# Patient Record
Sex: Male | Born: 2000 | Race: Black or African American | Hispanic: No | Marital: Single
Health system: Southern US, Community
[De-identification: ages and names within clinical notes are randomized; demographics above are authoritative.]

---

## 2020-08-16 ENCOUNTER — Encounter (HOSPITAL_COMMUNITY): Payer: Self-pay

## 2020-08-16 ENCOUNTER — Emergency Department (HOSPITAL_COMMUNITY): Payer: 59

## 2020-08-16 ENCOUNTER — Emergency Department (HOSPITAL_COMMUNITY)
Admission: EM | Admit: 2020-08-16 | Discharge: 2020-08-17 | Disposition: A | Discharge status: Home / Self Care | Payer: 59 | Attending: Emergency Medicine | Admitting: Emergency Medicine

## 2020-08-16 DIAGNOSIS — S6991XA Unspecified injury of right wrist, hand and finger(s), initial encounter: Secondary | ICD-10-CM | POA: Diagnosis present

## 2020-08-16 DIAGNOSIS — S62306A Unspecified fracture of fifth metacarpal bone, right hand, initial encounter for closed fracture: Secondary | ICD-10-CM | POA: Diagnosis not present

## 2020-08-16 DIAGNOSIS — W228XXA Striking against or struck by other objects, initial encounter: Secondary | ICD-10-CM | POA: Diagnosis not present

## 2020-08-16 DIAGNOSIS — S62346A Nondisplaced fracture of base of fifth metacarpal bone, right hand, initial encounter for closed fracture: Secondary | ICD-10-CM

## 2020-08-16 NOTE — ED Triage Notes (Signed)
Pt states that he punched a trailer yesterday and now having hand pain

## 2020-08-16 NOTE — ED Provider Notes (Signed)
Emergency Medicine Provider Triage Evaluation Note  Wayne Becker , a 20 y.o. male  was evaluated in triage.  Pt complains of right hand pain that started yesterday after he punched a trailer.  Review of Systems  Positive: Hand pain Negative: wound  Physical Exam  BP 123/72   Pulse 95   Temp 98.7 F (37.1 C) (Oral)   Resp 18   SpO2 97%  Gen:   Awake, no distress   Resp:  Normal effort  MSK:   Moves extremities without difficulty Other:  TTP over the right 4th and 5th metacarpals  Medical Decision Making  Medically screening exam initiated at 8:04 PM.  Appropriate orders placed.  Wayne Becker was informed that the remainder of the evaluation will be completed by another provider, this initial triage assessment does not replace that evaluation, and the importance of remaining in the ED until their evaluation is complete.     Wayne Becker 08/16/20 2004    Wayne Loveless, MD 08/16/20 [phone]

## 2020-08-17 NOTE — ED Notes (Signed)
Discharged home at [facility]

## 2020-08-17 NOTE — ED Provider Notes (Signed)
[facility] EMERGENCY DEPARTMENT Provider Note   CSN: [id number] Arrival date & time: 08/16/20  1958     History Chief Complaint  Patient presents with   Hand Pain    Wayne Becker is a 20 y.o. male with no past medical history who presents the emergency department with a chief complaint of right hand pain.  The patient reports sudden onset, intermittent right hand pain that began yesterday after he got angry and punched a trailer.  He reports the pain is brought on by moving his fingers of the right hand.  Improved with not moving the fingers of the right hand.  He denies numbness, weakness, right wrist or elbow pain.  No fever or chills.  No other complaints at this time.  No treatment prior to arrival.  The history is provided by medical records. No language interpreter was used.      History reviewed. No pertinent past medical history.  There are no problems to display for this patient.   History reviewed. No pertinent surgical history.     No family history on file.     Home Medications Prior to Admission medications   Not on File    Allergies    Patient has no known allergies.  Review of Systems   Review of Systems  Constitutional:  Negative for activity change.  Respiratory:  Negative for shortness of breath.   Cardiovascular:  Negative for chest pain.  Gastrointestinal:  Negative for abdominal pain.  Musculoskeletal:  Positive for arthralgias and myalgias. Negative for back pain.  Skin:  Negative for rash and wound.  Neurological:  Negative for weakness and numbness.   Physical Exam Updated Vital Signs BP 128/88   Pulse (!) 50   Temp 98.1 F (36.7 C)   Resp 16   SpO2 100%   Physical Exam Vitals and nursing note reviewed.  Constitutional:      Appearance: He is well-developed.  HENT:     Head: Normocephalic.  Eyes:     Conjunctiva/sclera: Conjunctivae normal.  Cardiovascular:     Rate and Rhythm: Normal rate and  regular rhythm.     Heart sounds: No murmur heard. Pulmonary:     Effort: Pulmonary effort is normal.  Abdominal:     General: There is no distension.     Palpations: Abdomen is soft.  Musculoskeletal:     Cervical back: Neck supple.     Comments: Point tenderness to palpation over the right fifth metacarpal.  No obvious deformities.  Full active and passive range of motion of all digits of the right hand.  Good capillary refill of all digits of the right hand.  Sensations intact all 4 distal aspects of all digits of the right hand.  Good grip strength.  Full active and passive range of motion of the right wrist and elbow.  No wounds noted to the right hand or fingers.  Skin:    General: Skin is warm and dry.  Neurological:     Mental Status: He is alert.  Psychiatric:        Behavior: Behavior normal.    ED Results / Procedures / Treatments   Labs (all labs ordered are listed, but only abnormal results are displayed) Labs Reviewed - No data to display  EKG None  Radiology DG Hand Complete Right  Result Date: 08/16/2020 CLINICAL DATA:  Status post trauma. EXAM: RIGHT HAND - COMPLETE 3+ VIEW COMPARISON:  None. FINDINGS: An acute fracture deformity is seen  involving the distal aspect of the fifth right metacarpal. There is no evidence of dislocation. Mild dorsal soft tissue swelling is seen. IMPRESSION: Acute fracture of the fifth right metacarpal. Electronically Signed   By: Aram Candela M.D.   On: 08/16/2020 20:17    Procedures Procedures   Medications Ordered in ED Medications - No data to display  ED Course  I have reviewed the triage vital signs and the nursing notes.  Pertinent labs & imaging results that were available during my care of the patient were reviewed by me and considered in my medical decision making (see chart for details).    MDM Rules/Calculators/A&P                         20 year old male who presents the emergency department with right hand pain  that began suddenly after he punched a trailer in anger yesterday.  No other associated symptoms.  Vital signs are reassuring.  On exam, he has neurovascular intact.  He has point tenderness palpation over the right fifth metacarpal.  No obvious deformities.  Vital signs are stable.  Imaging has been reviewed and independently interpreted by me.  He has a fracture of the right fifth metacarpal.  We will place the patient in an ulnar gutter splint.  He declines pain control at this time.  Recommended OTC analgesia and follow-up with hand surgery.  Patient is agreeable with plan.  ER return precautions given.  He is hemodynamically stable and in no acute distress.  Safe for discharge to home with outpatient follow-up to hand surgery. Final Clinical Impression(s) / ED Diagnoses Final diagnoses:  None    Rx / DC Orders ED Discharge Orders     None        Barkley Boards, PA-C 08/17/20 0536    Nira Conn, MD 08/17/20 [phone]

## 2020-08-17 NOTE — Progress Notes (Signed)
Orthopedic Tech Progress Note Patient Details:  [name] [birth year] [id number]  Ortho Devices Type of Ortho Device: Ulna gutter splint Ortho Device/Splint Location: RUE Ortho Device/Splint Interventions: Ordered, Application, Adjustment   Post Interventions Patient Tolerated: Well Instructions Provided: Care of device, Poper ambulation with device  Wayne Becker 08/17/2020, 2:21 AM

## 2022-06-23 IMAGING — CR DG HAND COMPLETE 3+V*R*
3 series · 3 of 3 positions shown · non-contrast
Comparison: None.

CLINICAL DATA: Status post trauma.

EXAM:
RIGHT HAND - COMPLETE 3+ VIEW

[hand pa]
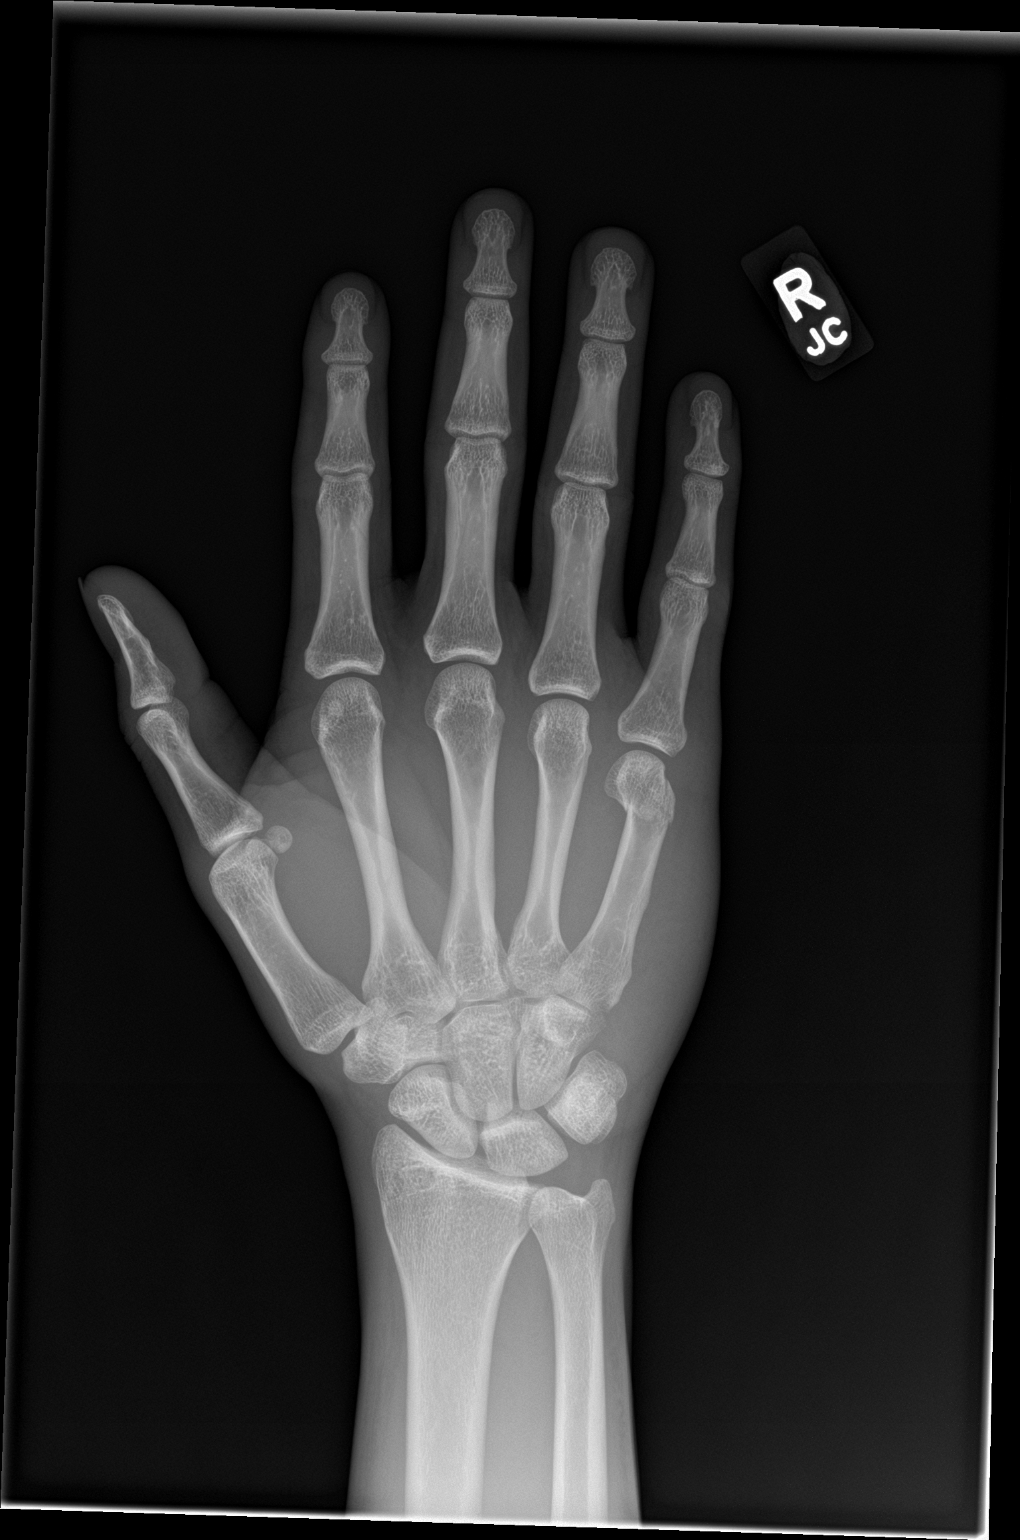

[hand obl]
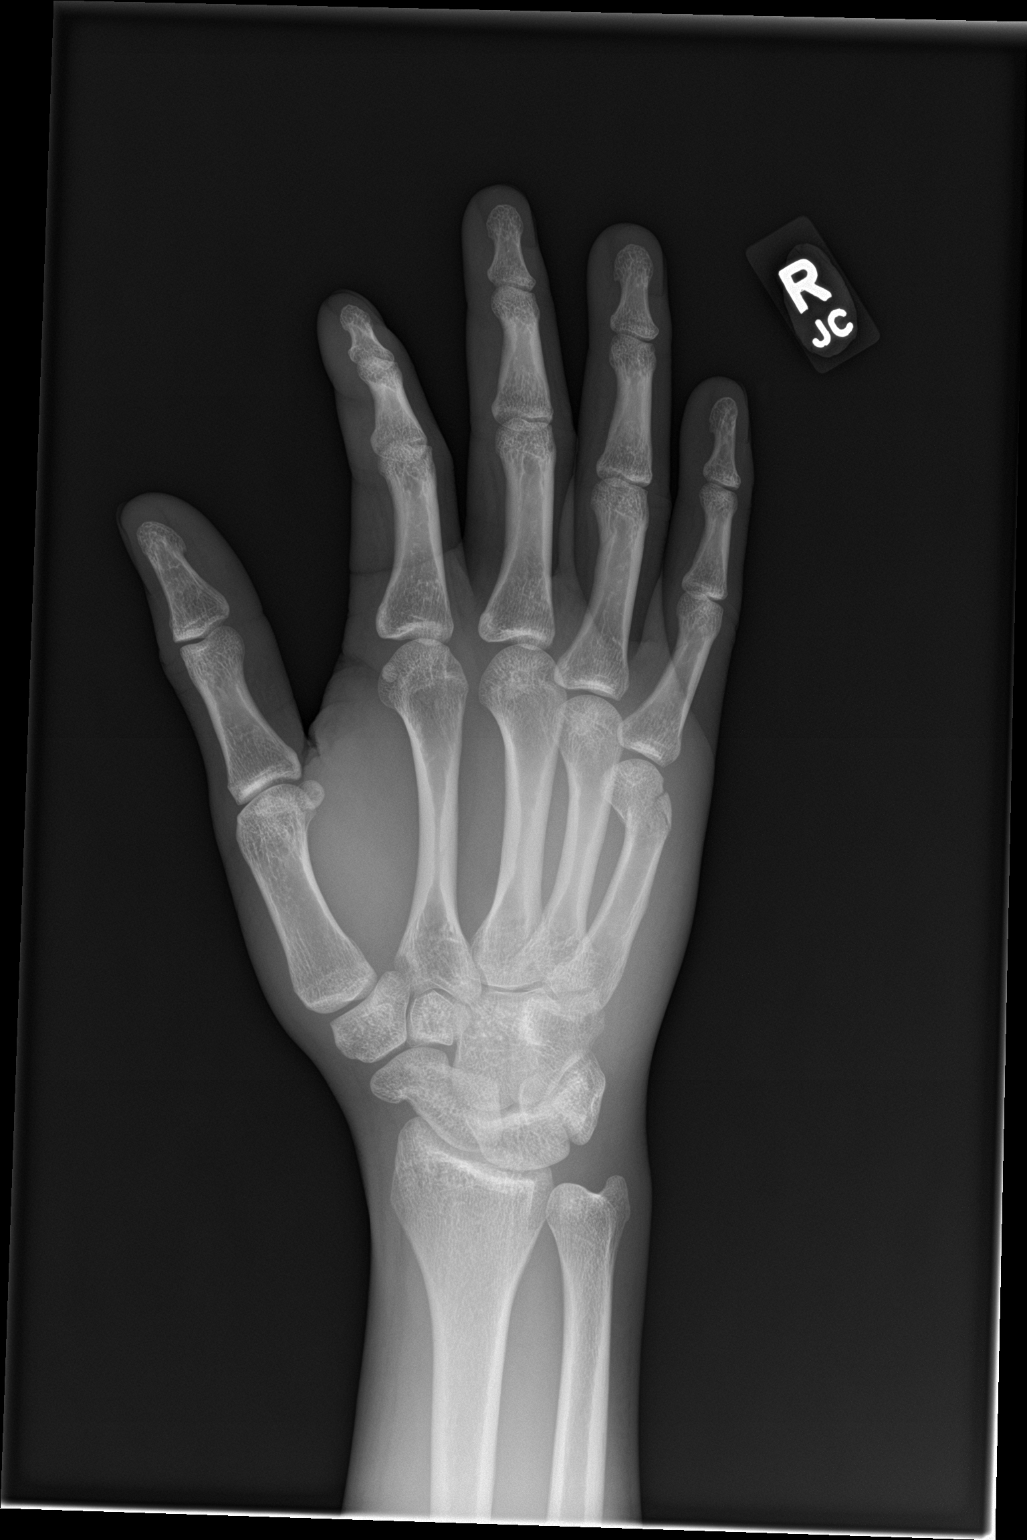

[hand lat]
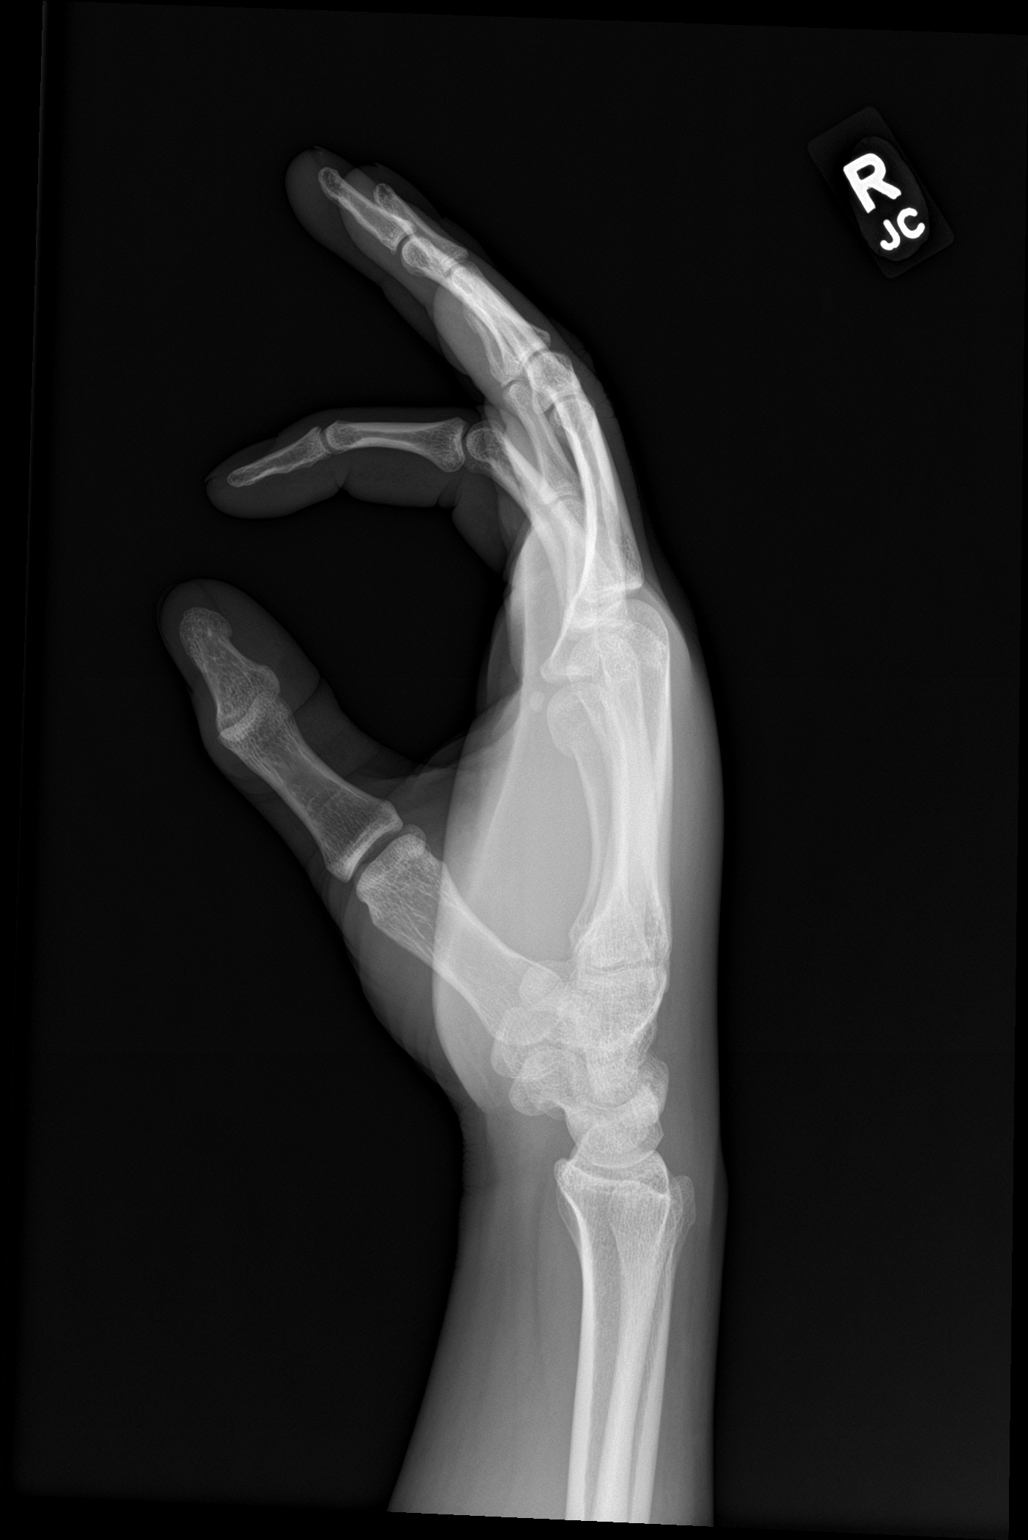

[3 of 3 positions shown; findings below may reference images not displayed]

FINDINGS: An acute fracture deformity is seen involving the distal aspect of
the fifth right metacarpal. There is no evidence of dislocation.
Mild dorsal soft tissue swelling is seen.
IMPRESSION: Acute fracture of the fifth right metacarpal.
